# Patient Record
Sex: Female | Born: 1970 | Hispanic: No | Marital: Single | Smoking: Never smoker
Health system: Southern US, Community
[De-identification: ages and names within clinical notes are randomized; demographics above are authoritative.]

## PROBLEM LIST (undated history)

## (undated) DIAGNOSIS — I471 Supraventricular tachycardia, unspecified: Secondary | ICD-10-CM

## (undated) DIAGNOSIS — K219 Gastro-esophageal reflux disease without esophagitis: Secondary | ICD-10-CM

---

## 2007-07-11 ENCOUNTER — Other Ambulatory Visit: Admission: RE | Admit: 2007-07-11 | Discharge: 2007-07-11 | Payer: Self-pay | Admitting: Family Medicine

## 2008-01-22 ENCOUNTER — Emergency Department (HOSPITAL_COMMUNITY): Admission: EM | Admit: 2008-01-22 | Discharge: 2008-01-22 | Payer: Self-pay | Admitting: Family Medicine

## 2008-05-08 ENCOUNTER — Emergency Department (HOSPITAL_COMMUNITY): Admission: EM | Admit: 2008-05-08 | Discharge: 2008-05-08 | Payer: Self-pay | Admitting: Family Medicine

## 2008-05-12 ENCOUNTER — Emergency Department (HOSPITAL_COMMUNITY): Admission: EM | Admit: 2008-05-12 | Discharge: 2008-05-12 | Payer: Self-pay | Admitting: Emergency Medicine

## 2008-07-26 ENCOUNTER — Other Ambulatory Visit: Admission: RE | Admit: 2008-07-26 | Discharge: 2008-07-26 | Payer: Self-pay | Admitting: Family Medicine

## 2009-10-24 ENCOUNTER — Emergency Department (HOSPITAL_COMMUNITY): Admission: EM | Admit: 2009-10-24 | Discharge: 2009-10-25 | Payer: Self-pay | Admitting: Emergency Medicine

## 2009-10-30 ENCOUNTER — Encounter: Admission: RE | Admit: 2009-10-30 | Discharge: 2009-10-30 | Payer: Self-pay | Admitting: Chiropractic Medicine

## 2010-06-05 LAB — POCT I-STAT, CHEM 8
BUN: 16 mg/dL (ref 6–23)
Calcium, Ion: 1.19 mmol/L (ref 1.12–1.32)
Chloride: 105 mEq/L (ref 96–112)
Creatinine, Ser: 0.9 mg/dL (ref 0.4–1.2)
Glucose, Bld: 85 mg/dL (ref 70–99)
Sodium: 140 mEq/L (ref 135–145)
TCO2: 25 mmol/L (ref 0–100)

## 2010-06-05 LAB — POCT PREGNANCY, URINE: Preg Test, Ur: NEGATIVE

## 2010-06-05 LAB — URINALYSIS, ROUTINE W REFLEX MICROSCOPIC
Glucose, UA: NEGATIVE mg/dL
Ketones, ur: 15 mg/dL — AB
pH: 5.5 (ref 5.0–8.0)

## 2013-07-28 ENCOUNTER — Other Ambulatory Visit: Payer: Self-pay | Admitting: *Deleted

## 2013-07-28 DIAGNOSIS — R002 Palpitations: Secondary | ICD-10-CM

## 2013-07-28 DIAGNOSIS — R42 Dizziness and giddiness: Secondary | ICD-10-CM

## 2013-08-11 ENCOUNTER — Encounter (INDEPENDENT_AMBULATORY_CARE_PROVIDER_SITE_OTHER): Payer: BC Managed Care – PPO

## 2013-08-11 ENCOUNTER — Encounter: Payer: Self-pay | Admitting: *Deleted

## 2013-08-11 DIAGNOSIS — R002 Palpitations: Secondary | ICD-10-CM

## 2013-08-11 DIAGNOSIS — R42 Dizziness and giddiness: Secondary | ICD-10-CM

## 2013-08-11 NOTE — Progress Notes (Signed)
Patient ID: Hannah Buttnerhristina A Gnau, female   DOB: December 16, 1970, 43 y.o.   MRN: [id number] [employer] 30 day cardiac event monitor applied to patient.

## 2015-08-22 ENCOUNTER — Other Ambulatory Visit (HOSPITAL_COMMUNITY): Payer: Self-pay | Admitting: Family Medicine

## 2015-08-22 DIAGNOSIS — I471 Supraventricular tachycardia: Secondary | ICD-10-CM

## 2015-09-06 ENCOUNTER — Ambulatory Visit (HOSPITAL_COMMUNITY): Payer: 59 | Attending: Cardiology

## 2015-09-06 DIAGNOSIS — I071 Rheumatic tricuspid insufficiency: Secondary | ICD-10-CM | POA: Diagnosis not present

## 2015-09-06 DIAGNOSIS — Z8249 Family history of ischemic heart disease and other diseases of the circulatory system: Secondary | ICD-10-CM | POA: Insufficient documentation

## 2015-09-06 DIAGNOSIS — I471 Supraventricular tachycardia: Secondary | ICD-10-CM | POA: Diagnosis not present

## 2015-09-06 DIAGNOSIS — I340 Nonrheumatic mitral (valve) insufficiency: Secondary | ICD-10-CM | POA: Diagnosis not present

## 2015-10-16 ENCOUNTER — Ambulatory Visit (HOSPITAL_COMMUNITY)
Admission: EM | Admit: 2015-10-16 | Discharge: 2015-10-16 | Disposition: A | Discharge status: Home / Self Care | Payer: 59 | Attending: Family Medicine | Admitting: Family Medicine

## 2015-10-16 ENCOUNTER — Encounter (HOSPITAL_COMMUNITY): Payer: Self-pay | Admitting: Emergency Medicine

## 2015-10-16 DIAGNOSIS — R519 Headache, unspecified: Secondary | ICD-10-CM

## 2015-10-16 DIAGNOSIS — R0789 Other chest pain: Secondary | ICD-10-CM

## 2015-10-16 DIAGNOSIS — R51 Headache: Secondary | ICD-10-CM

## 2015-10-16 HISTORY — DX: Gastro-esophageal reflux disease without esophagitis: K21.9

## 2015-10-16 HISTORY — DX: Supraventricular tachycardia, unspecified: I47.10

## 2015-10-16 HISTORY — DX: Supraventricular tachycardia: I47.1

## 2015-10-16 NOTE — ED Triage Notes (Signed)
The [facility] with a complaint of a headache along with pain that started in her chest and has moved over the 4 days into her back, under arm, and neck. The patient denied any SOB.

## 2015-10-16 NOTE — ED Provider Notes (Signed)
CSN: [id number]     Arrival date & time 10/16/15  1219 History   First MD Initiated Contact with Patient 10/16/15 1320     Chief Complaint  Patient presents with  . Headache   (Consider location/radiation/quality/duration/timing/severity/associated sxs/prior Treatment) Hannah Hunter is a well-appearing 45 y.o female with recent history of SVT 2 months ago, presents today for headache, chest tightness, and left arm numbing pain. She started to experience a mild pulsating headache 6 days ago that didn't feel like a typical migraine to her, this headache went away on the same day after taking ibuprofen. Next day (5 days ago), patient started to have chest tightness and pain that is dull and radiates to her bilateral upper back. Patient is unsure if her back pain is related to poor posture from her recent long road trip. The chest pain and the upper back pain have been on and off, describes the pain as aching 1/10. She also endorses some shortness of breath on some occasional, heart palpitation at times and lightheadedness. Currently she is asymptomatic in room. Patient has a cardiologist, recent check-up with cardiologist was on 07/26.       Past Medical History:  Diagnosis Date  . GERD (gastroesophageal reflux disease)   . SVT (supraventricular tachycardia) (HCC)    History reviewed. No pertinent surgical history. History reviewed. No pertinent family history. Social History  Substance Use Topics  . Smoking status: Never Smoker  . Smokeless tobacco: Never Used  . Alcohol use Yes     Comment: occassional   OB History    No data available     Review of Systems  Constitutional: Negative for chills, fatigue and fever.  Respiratory: Positive for chest tightness and shortness of breath. Negative for wheezing.   Cardiovascular: Positive for chest pain and palpitations. Negative for leg swelling.  Gastrointestinal: Negative for abdominal pain, diarrhea, nausea and vomiting.  Musculoskeletal:  Positive for back pain.  Neurological: Positive for light-headedness and headaches. Negative for dizziness, syncope and weakness.    Allergies  Review of patient's allergies indicates no known allergies.  Home Medications   Prior to Admission medications   Medication Sig Start Date End Date Taking? Authorizing Provider  diltiazem (CARTIA XT) 120 MG 24 hr capsule Take 120 mg by mouth daily.   Yes Historical Provider, MD  omeprazole (PRILOSEC) 20 MG capsule Take 20 mg by mouth daily.   Yes Historical Provider, MD   Meds Ordered and Administered this Visit  Medications - No data to display  BP 134/90 (BP Location: Right Arm)   Pulse 64   Temp 98.7 F (37.1 C) (Oral)   Resp 16   Ht 5\' 7"  (1.702 m)   Wt 180 lb (81.6 kg)   LMP 10/07/2015 (Exact Date)   SpO2 98%   BMI 28.19 kg/m  No data found.   Physical Exam  Constitutional: She is oriented to person, place, and time. She appears well-developed and well-nourished.  HENT:  Head: Normocephalic and atraumatic.  Right Ear: External ear normal.  Left Ear: External ear normal.  Eyes: Conjunctivae are normal. Pupils are equal, round, and reactive to light.  Neck: Normal range of motion. Neck supple.  Cardiovascular: Normal rate, regular rhythm and normal heart sounds.   Pulmonary/Chest: Effort normal and breath sounds normal.  Abdominal: Soft. Bowel sounds are normal. She exhibits no mass. There is no tenderness.  Musculoskeletal: Normal range of motion.  Neurological: She is alert and oriented to person, place, and time. Coordination normal.  Skin: Skin is warm and dry.  Psychiatric: She has a normal mood and affect.  Nursing note and vitals reviewed.   Urgent Care Course   Clinical Course    Procedures (including critical care time)  Labs Review Labs Reviewed - No data to display  Imaging Review No results found.     MDM   1. Chest tightness   2. Intractable headache, unspecified chronicity pattern,  unspecified headache type    Sinus rhythm on EKG. There is no axis deviation. There is no ectopy, no evidence of heart block or conduction disturbance, no bundle branch block, no pathological Q wave, no ST segment deviation, no T wave inversion, no evidence of pre-excitation or prolonged QT interval and no evidence of chamber enlargement. Patient appears in no acute distress, her physical examination was normal with no red flags noted. She is currently asymptomatic. Given her recent SVT and history,  patient recommended to follow up with her cardiologist today, if not able to get in with her cardiologist today then recommended to go to the Emergency department. Patient was able to make an appt with her cardiologist for today,  and is leaving here to head straight to her cardiologist's office [facility] for further evaluation.    Lucia EstelleFeng Dayson Aboud, NP 10/16/15 [phone]

## 2015-11-13 ENCOUNTER — Other Ambulatory Visit (HOSPITAL_BASED_OUTPATIENT_CLINIC_OR_DEPARTMENT_OTHER): Payer: Self-pay | Admitting: Family Medicine

## 2015-11-13 DIAGNOSIS — Z1231 Encounter for screening mammogram for malignant neoplasm of breast: Secondary | ICD-10-CM

## 2015-11-19 ENCOUNTER — Ambulatory Visit (HOSPITAL_BASED_OUTPATIENT_CLINIC_OR_DEPARTMENT_OTHER)
Admission: RE | Admit: 2015-11-19 | Discharge: 2015-11-19 | Disposition: A | Discharge status: Home / Self Care | Payer: 59 | Source: Ambulatory Visit | Attending: Family Medicine | Admitting: Family Medicine

## 2015-11-19 DIAGNOSIS — Z1231 Encounter for screening mammogram for malignant neoplasm of breast: Secondary | ICD-10-CM | POA: Insufficient documentation

## 2016-03-26 DIAGNOSIS — I471 Supraventricular tachycardia: Secondary | ICD-10-CM | POA: Diagnosis not present

## 2016-03-26 DIAGNOSIS — R5383 Other fatigue: Secondary | ICD-10-CM | POA: Diagnosis not present

## 2016-03-26 DIAGNOSIS — Z8249 Family history of ischemic heart disease and other diseases of the circulatory system: Secondary | ICD-10-CM | POA: Diagnosis not present

## 2016-04-03 DIAGNOSIS — R5383 Other fatigue: Secondary | ICD-10-CM | POA: Diagnosis not present

## 2016-04-03 DIAGNOSIS — I471 Supraventricular tachycardia: Secondary | ICD-10-CM | POA: Diagnosis not present

## 2016-05-02 DIAGNOSIS — R5383 Other fatigue: Secondary | ICD-10-CM | POA: Diagnosis not present

## 2016-05-02 DIAGNOSIS — I471 Supraventricular tachycardia: Secondary | ICD-10-CM | POA: Diagnosis not present

## 2016-11-10 DIAGNOSIS — Z0000 Encounter for general adult medical examination without abnormal findings: Secondary | ICD-10-CM | POA: Diagnosis not present

## 2016-11-20 DIAGNOSIS — Z0000 Encounter for general adult medical examination without abnormal findings: Secondary | ICD-10-CM | POA: Diagnosis not present

## 2017-10-22 DIAGNOSIS — R531 Weakness: Secondary | ICD-10-CM | POA: Diagnosis not present

## 2017-10-22 DIAGNOSIS — Z131 Encounter for screening for diabetes mellitus: Secondary | ICD-10-CM | POA: Diagnosis not present

## 2017-10-22 DIAGNOSIS — Z0000 Encounter for general adult medical examination without abnormal findings: Secondary | ICD-10-CM | POA: Diagnosis not present

## 2017-10-22 DIAGNOSIS — E7800 Pure hypercholesterolemia, unspecified: Secondary | ICD-10-CM | POA: Diagnosis not present

## 2017-10-29 ENCOUNTER — Other Ambulatory Visit (HOSPITAL_BASED_OUTPATIENT_CLINIC_OR_DEPARTMENT_OTHER): Payer: Self-pay | Admitting: Adult Health Nurse Practitioner

## 2017-10-29 DIAGNOSIS — Z1231 Encounter for screening mammogram for malignant neoplasm of breast: Secondary | ICD-10-CM

## 2018-01-31 DIAGNOSIS — R5383 Other fatigue: Secondary | ICD-10-CM | POA: Diagnosis not present

## 2018-01-31 DIAGNOSIS — I471 Supraventricular tachycardia: Secondary | ICD-10-CM | POA: Diagnosis not present

## 2018-01-31 DIAGNOSIS — R9431 Abnormal electrocardiogram [ECG] [EKG]: Secondary | ICD-10-CM | POA: Diagnosis not present

## 2018-01-31 DIAGNOSIS — Z79899 Other long term (current) drug therapy: Secondary | ICD-10-CM | POA: Diagnosis not present

## 2018-01-31 DIAGNOSIS — R001 Bradycardia, unspecified: Secondary | ICD-10-CM | POA: Diagnosis not present

## 2019-01-06 ENCOUNTER — Other Ambulatory Visit (HOSPITAL_BASED_OUTPATIENT_CLINIC_OR_DEPARTMENT_OTHER): Payer: Self-pay | Admitting: Family Medicine

## 2019-01-06 DIAGNOSIS — Z1231 Encounter for screening mammogram for malignant neoplasm of breast: Secondary | ICD-10-CM

## 2019-01-24 ENCOUNTER — Ambulatory Visit (HOSPITAL_BASED_OUTPATIENT_CLINIC_OR_DEPARTMENT_OTHER)
Admission: RE | Admit: 2019-01-24 | Discharge: 2019-01-24 | Disposition: A | Discharge status: Home / Self Care | Payer: 59 | Source: Ambulatory Visit | Attending: Family Medicine | Admitting: Family Medicine

## 2019-01-24 ENCOUNTER — Encounter (HOSPITAL_BASED_OUTPATIENT_CLINIC_OR_DEPARTMENT_OTHER): Payer: Self-pay

## 2019-01-24 ENCOUNTER — Other Ambulatory Visit: Payer: Self-pay

## 2019-01-24 DIAGNOSIS — Z1231 Encounter for screening mammogram for malignant neoplasm of breast: Secondary | ICD-10-CM | POA: Diagnosis not present

## 2019-01-25 ENCOUNTER — Other Ambulatory Visit: Payer: Self-pay | Admitting: Family Medicine

## 2019-01-25 DIAGNOSIS — R928 Other abnormal and inconclusive findings on diagnostic imaging of breast: Secondary | ICD-10-CM

## 2019-01-27 ENCOUNTER — Other Ambulatory Visit: Payer: Self-pay

## 2019-01-27 ENCOUNTER — Ambulatory Visit
Admission: RE | Admit: 2019-01-27 | Discharge: 2019-01-27 | Disposition: A | Discharge status: Home / Self Care | Payer: 59 | Source: Ambulatory Visit | Attending: Family Medicine | Admitting: Family Medicine

## 2019-01-27 ENCOUNTER — Ambulatory Visit: Payer: 59

## 2019-01-27 DIAGNOSIS — R928 Other abnormal and inconclusive findings on diagnostic imaging of breast: Secondary | ICD-10-CM

## 2020-12-30 ENCOUNTER — Other Ambulatory Visit: Payer: Self-pay | Admitting: Family Medicine

## 2020-12-30 DIAGNOSIS — Z1231 Encounter for screening mammogram for malignant neoplasm of breast: Secondary | ICD-10-CM

## 2021-01-02 ENCOUNTER — Ambulatory Visit: Payer: 59

## 2021-03-17 ENCOUNTER — Ambulatory Visit
Admission: RE | Admit: 2021-03-17 | Discharge: 2021-03-17 | Disposition: A | Discharge status: Home / Self Care | Payer: 59 | Source: Ambulatory Visit | Attending: Family Medicine | Admitting: Family Medicine

## 2021-03-17 DIAGNOSIS — Z1231 Encounter for screening mammogram for malignant neoplasm of breast: Secondary | ICD-10-CM

## 2021-09-25 ENCOUNTER — Other Ambulatory Visit: Payer: Self-pay | Admitting: Sports Medicine

## 2021-09-25 ENCOUNTER — Ambulatory Visit
Admission: RE | Admit: 2021-09-25 | Discharge: 2021-09-25 | Disposition: A | Discharge status: Home / Self Care | Payer: 59 | Source: Ambulatory Visit | Attending: Sports Medicine | Admitting: Sports Medicine

## 2021-09-25 DIAGNOSIS — M542 Cervicalgia: Secondary | ICD-10-CM

## 2021-11-24 ENCOUNTER — Ambulatory Visit (INDEPENDENT_AMBULATORY_CARE_PROVIDER_SITE_OTHER): Payer: 59 | Admitting: Neurology

## 2021-11-24 ENCOUNTER — Encounter: Payer: Self-pay | Admitting: Neurology

## 2021-11-24 VITALS — BP 132/79 | HR 64 | Ht 67.0 in | Wt 202.8 lb

## 2021-11-24 DIAGNOSIS — G509 Disorder of trigeminal nerve, unspecified: Secondary | ICD-10-CM

## 2021-11-24 DIAGNOSIS — R200 Anesthesia of skin: Secondary | ICD-10-CM

## 2021-11-24 DIAGNOSIS — R202 Paresthesia of skin: Secondary | ICD-10-CM | POA: Diagnosis not present

## 2021-11-24 DIAGNOSIS — R42 Dizziness and giddiness: Secondary | ICD-10-CM | POA: Diagnosis not present

## 2021-11-24 NOTE — Patient Instructions (Signed)
MRi of the brain 

## 2021-11-24 NOTE — Progress Notes (Addendum)
[facility]    Provider:  Dr Lucia Gaskins Requesting Provider: Jene Every, MD Primary Care Provider:  Joycelyn Rua, MD  CC:  Left facial numbness and tingling  HPI:  Hannah Hunter is a 51 y.o. female here as requested by Jene Every, MD for face dyesthesias. PMHx chronic neck pain, SVT s/p ablation, migraines once a month during period helped with ibuprofen s/p MRI cervical spine and seen Dr. Shelle Iron for this, per his notes that I reviewed she had a cervical spine MRI which showed a chronic T3 compression fracture mild superior endplate, moderate foraminal narrowing at C5-C6 on the left due to disc osteophyte complex, mild foraminal narrowing on the right at C6-C7.  His impression was intermittent axial cervical pain secondary to cervical spondylosis at C5-C6 C6-C7, no radiculopathy, neurologically intact, remote T3 compression fracture, increasing facial dysesthesias frontal lateral etiology indeterminant for which he sent her here.  He sent her for dysesthesias in the facial region.  Symptoms come and go. Started in 2019. She has some tingling in her left eye brow, side of the nose and upper and lower lip. She gets migraines once a month and treated with OTC meds during her period. The tingling will go away for a few months. In July she had dizziness with the tinglingin the pace and paresthesias in the back of the scalp and lasted a week and was very bad and that is when she made the appointments, sensations in the face for 4 years. The sensations are not painful more annoting. No muscle weakness, no drooping, no headache, no vision changes, the paresthesias will last for days continuously, PT and massage on the back neck tightness in traps and cervical paraspinals does help with the tingling, triggers can be sitting at computer for long time. No other focal neurologic deficits, associated symptoms, inciting events or modifiable factors.   Reviewed notes, labs and imaging  from outside physicians, which showed:   CT head 10/25/2009: Clinical Data: Vertigo for several months, worse tonight.     CT HEAD WITHOUT CONTRAST     Technique:  Contiguous axial images were obtained from the base of  the skull through the vertex without contrast.     Comparison: Head CT and angiography 05/12/2008.     Findings: There is no evidence of acute intracranial hemorrhage,  mass lesion, brain edema or extra-axial fluid collection.  The  ventricles and subarachnoid spaces are appropriately sized for age.  There is no CT evidence of acute cortical infarction.     The visualized paranasal sinuses are clear. The mastoids and middle  ears are clear. The calvarium is intact.     IMPRESSION:  Stable examination.  No acute intracranial findings.   Review of Systems: Patient complains of symptoms per HPI as well as the following symptoms: neck pain and muscle tightness. Pertinent negatives and positives per HPI. All others negative.  Cmp 08/2021 nml   Social History   Socioeconomic History   Marital status: Single    Spouse name: Not on file   Number of children: Not on file   Years of education: Not on file   Highest education level: Not on file  Occupational History   Not on file  Tobacco Use   Smoking status: Never   Smokeless tobacco: Never  Vaping Use   Vaping Use: Never used  Substance and Sexual Activity   Alcohol use: Yes    Comment: occassional   Drug use: No   Sexual activity: Not  on file  Other Topics Concern   Not on file  Social History Narrative   Not on file   Social Determinants of Health   Financial Resource Strain: Not on file  Food Insecurity: Not on file  Transportation Needs: Not on file  Physical Activity: Not on file  Stress: Not on file  Social Connections: Not on file  Intimate Partner Violence: Not on file    Family History  Problem Relation Age of Onset   Stroke Mother    Breast cancer Neg Hx    Neuropathy Neg Hx      Past Medical History:  Diagnosis Date   GERD (gastroesophageal reflux disease)    SVT (supraventricular tachycardia)     There are no problems to display for this patient.   History reviewed. No pertinent surgical history.  Current Outpatient Medications  Medication Sig Dispense Refill   Ascorbic Acid (VITAMIN C PO) Take by mouth.     Omega-3 Fatty Acids (FISH OIL PO) Take by mouth.     VITAMIN D PO Take by mouth.     No current facility-administered medications for this visit.    Allergies as of 11/24/2021   (No Known Allergies)    Vitals: BP 132/79   Pulse 64   Ht 5\' 7"  (1.702 m)   Wt 202 lb 12.8 oz (92 kg)   BMI 31.76 kg/m  Last Weight:  Wt Readings from Last 1 Encounters:  11/24/21 202 lb 12.8 oz (92 kg)   Last Height:   Ht Readings from Last 1 Encounters:  11/24/21 5\' 7"  (1.702 m)     Physical exam: Exam: Gen: NAD, conversant, well nourised, obese, well groomed                     CV: RRR, no MRG. No Carotid Bruits. No peripheral edema, warm, nontender Eyes: Conjunctivae clear without exudates or hemorrhage  Neuro: Detailed Neurologic Exam  Speech:    Speech is normal; fluent and spontaneous with normal comprehension.  Cognition:    The patient is oriented to person, place, and time;     recent and remote memory intact;     language fluent;     normal attention, concentration,     fund of knowledge Cranial Nerves:    The pupils are round, and reactive to light, left slightly smaller 94mm(chronic since 2010 and was evaluated in the past for this after head injury). The fundi are normal and spontaneous venous pulsations are present. Visual fields are full to finger confrontation. Extraocular movements are intact. Trigeminal sensation is intact and the muscles of mastication are normal. The face is symmetric. The palate elevates in the midline. Hearing intact. Voice is normal. Shoulder shrug is normal. The tongue has normal motion without  fasciculations.   Coordination:    Normal   Gait:    normal.   Motor Observation:    No asymmetry, no atrophy, and no involuntary movements noted. Tone:    Normal muscle tone.    Posture:    Posture is normal. normal erect    Strength:    Strength is V/V in the upper and lower limbs.      Sensation: intact to LT     Reflex Exam:  DTR's:    Deep tendon reflexes in the upper and lower extremities are normal bilaterally.   Toes:    The toes are downgoing bilaterally.   Clonus:    2 beats at AJs (may be normal)  Assessment/Plan:  51 y.o. female here as requested by Jene Every, MD for face paresthesias left side of face that can last for days, episodic since 2019, left eye brow, side of the nose and upper and lower lip. , not assicated with migraines or other focal neurologic symptoms except sn episode with severe dizziness and felt she was foggy and paresthesias in the back of the head as well . PMHx chronic neck pain, SVT s/p ablation, migraines once a month during period helped with ibuprofen s/p unremarkable MRI cervical spine and seen Dr. Shelle Iron for her neck. She just wants to make sure everything is ok and we can order an MRI.   MRI brain due to concerning symptoms of left facial numbness and paresthesias, occipital paresthesias, dizziness with cognitive changes  to look for space occupying mass, chiari or intracranial hypertension (pseudotumor), strokes, malignancies, vasculidities, demyelination(multiple sclerosis) or other   Orders Placed This Encounter  Procedures   MR BRAIN W WO CONTRAST   No orders of the defined types were placed in this encounter.   Cc: Jene Every, MD,  Joycelyn Rua, MD  Naomie Dean, MD  [facility] [address] [place], [place] [address]  Phone [phone] Fax [phone]

## 2021-11-26 ENCOUNTER — Telehealth: Payer: Self-pay | Admitting: Neurology

## 2021-11-26 NOTE — Telephone Encounter (Signed)
VM box full, sent mychart msg asking pt to call back and schedule MRI.  [facility] auth: npr case # [id number]

## 2021-12-03 ENCOUNTER — Ambulatory Visit (INDEPENDENT_AMBULATORY_CARE_PROVIDER_SITE_OTHER): Payer: 59

## 2021-12-03 DIAGNOSIS — R200 Anesthesia of skin: Secondary | ICD-10-CM

## 2021-12-03 DIAGNOSIS — R202 Paresthesia of skin: Secondary | ICD-10-CM | POA: Diagnosis not present

## 2021-12-03 DIAGNOSIS — R42 Dizziness and giddiness: Secondary | ICD-10-CM

## 2021-12-03 DIAGNOSIS — G509 Disorder of trigeminal nerve, unspecified: Secondary | ICD-10-CM | POA: Diagnosis not present

## 2021-12-03 MED ORDER — GADOBENATE DIMEGLUMINE 529 MG/ML IV SOLN
20.0000 mL | Freq: Once | INTRAVENOUS | Status: AC | PRN
  Administered 2021-12-03: 20 mL via INTRAVENOUS

## 2021-12-08 ENCOUNTER — Telehealth: Payer: Self-pay | Admitting: Neurology

## 2021-12-08 DIAGNOSIS — G509 Disorder of trigeminal nerve, unspecified: Secondary | ICD-10-CM

## 2021-12-08 NOTE — Telephone Encounter (Signed)
I called patient to discuss. VM not available. Unable to leave a message. I sent a mychart message.

## 2021-12-08 NOTE — Telephone Encounter (Signed)
Patient returned my call.  She has many questions about the vascular loop.  She is wondering if the vascular loop could cause vertigo.  She also is wondering if this is a congenital malformation.  She is wondering if she is at risk for aneurysms.  I offered her an appointment with Dr. Jaynee Eagles but there are no soon appointments.  She is wondering if Dr. Jaynee Eagles would mind calling her to discuss this as scheduled for more detail.  She is reluctant to proceed with neurosurgery.  She feels like her symptoms are manageable but has many concerns about the vascular loop itself.

## 2021-12-08 NOTE — Telephone Encounter (Signed)
Patient has a vasacular loop compressing the trigeminal nerve. I can send her to Dr. Zada Finders at [place] [facility] or Dr. Arlan Organ at [facility] to discuss surgical options if she likes please call and discuss (see phone note)

## 2021-12-08 NOTE — Telephone Encounter (Signed)
I am referring her to neurosurgery so they can dicuss all of this with her, it is just a consult not the actual surgery, just for her to know her options but she does not have to see them at all  The vascular loop won;t cause vertigo it is just irritating the trigeminal nerve by rubbing against it and that's what causes the facial pain. If the pain is manageable we don;t have to do anything about it. No aneurysms seen It can be a congenital malformation but usually the brain changes as we age and now the blood vessel is touching a nerve, it is the most common reason we see for trigeminal neuralgia and we did discuss it at appointment. Nothing has to be done about it, it's not causing any damage, it just can cause pain when it irritates the nerve If she has many questions, the most appropriate would be an appointment even video I have some openings Nov 14/15th now, in the meantime reassure her this is not an emergency or even urgent nothing will happen to her again the blood vessel just irritates the nerve just like if she had sciatica or any other nerve irritation thanks thanks

## 2021-12-09 ENCOUNTER — Telehealth: Payer: Self-pay | Admitting: Neurology

## 2021-12-09 NOTE — Addendum Note (Signed)
Addended by: Lester Dana A on: 12/09/2021 03:53 PM   Modules accepted: Orders

## 2021-12-09 NOTE — Telephone Encounter (Signed)
Sent referral to Dr. Zada Finders at [facility], phone # [phone].

## 2022-01-14 ENCOUNTER — Other Ambulatory Visit: Payer: Self-pay | Admitting: Family Medicine

## 2022-01-14 DIAGNOSIS — R1011 Right upper quadrant pain: Secondary | ICD-10-CM

## 2022-01-21 ENCOUNTER — Ambulatory Visit (INDEPENDENT_AMBULATORY_CARE_PROVIDER_SITE_OTHER): Payer: 59

## 2022-01-21 DIAGNOSIS — R1011 Right upper quadrant pain: Secondary | ICD-10-CM | POA: Diagnosis not present

## 2022-02-03 IMAGING — MG MM DIGITAL SCREENING BILAT W/ TOMO AND CAD
8 series · 9 of 24 positions shown · non-contrast
Comparison: Previous exam(s).

CLINICAL DATA: Screening.

EXAM:
DIGITAL SCREENING BILATERAL MAMMOGRAM WITH TOMOSYNTHESIS AND CAD
TECHNIQUE: Bilateral screening digital craniocaudal and mediolateral oblique
mammograms were obtained. Bilateral screening digital breast
tomosynthesis was performed. The images were evaluated with
computer-aided detection.

[L CC synth-2D]
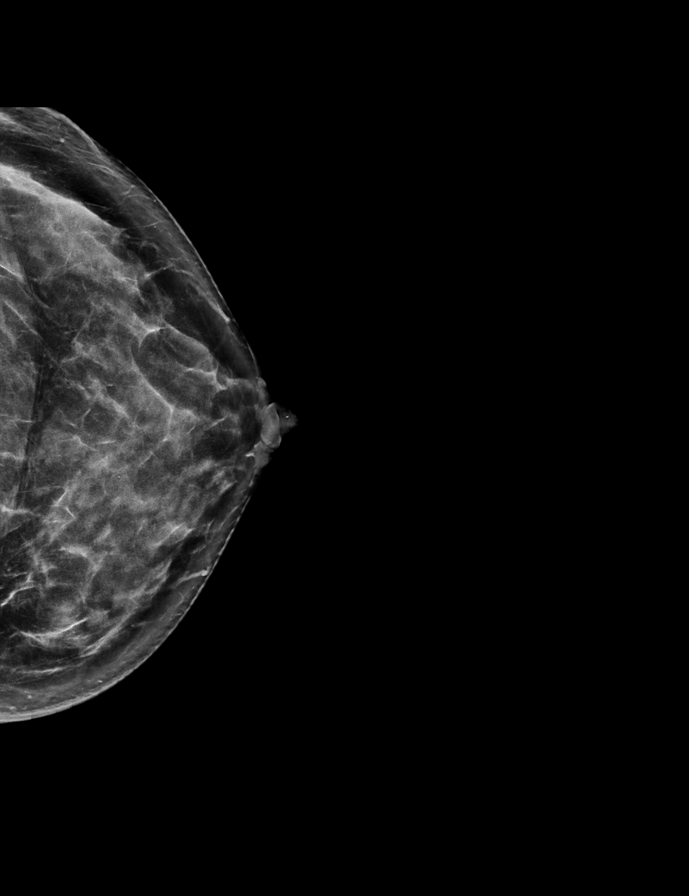

[R MLO synth-2D]
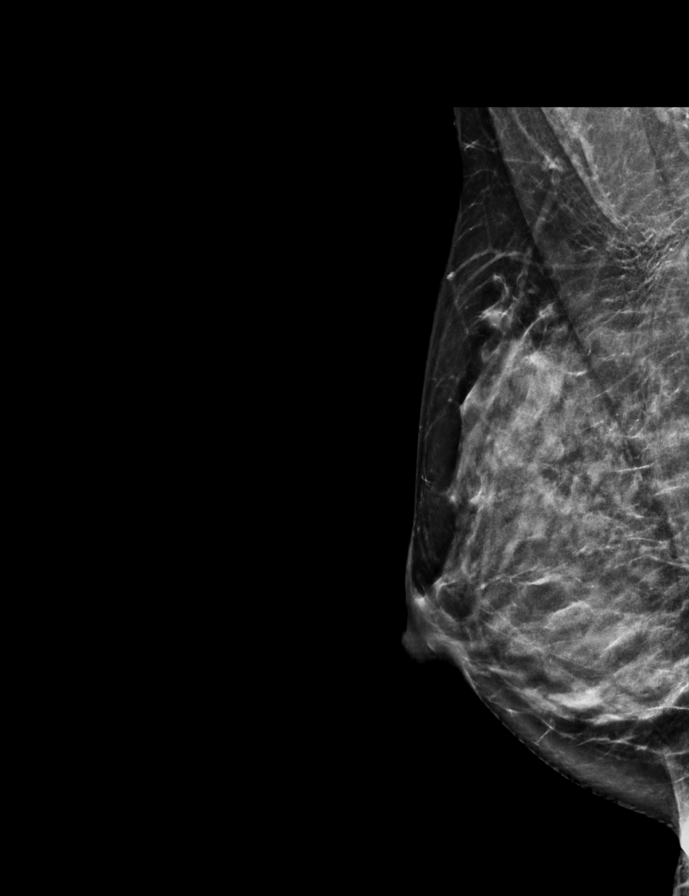

[R CC synth-2D]
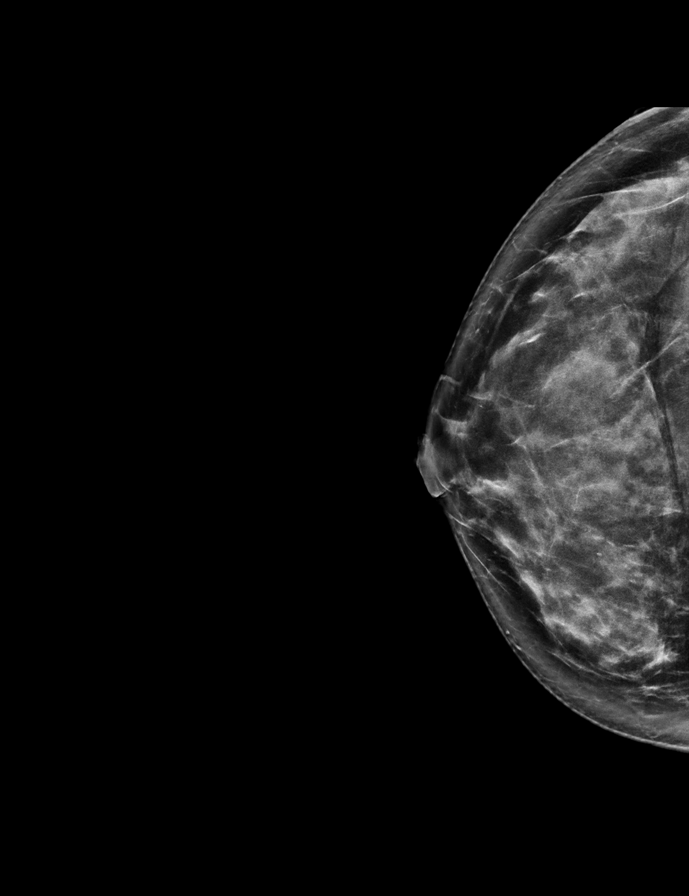

[L MLO synth-2D]
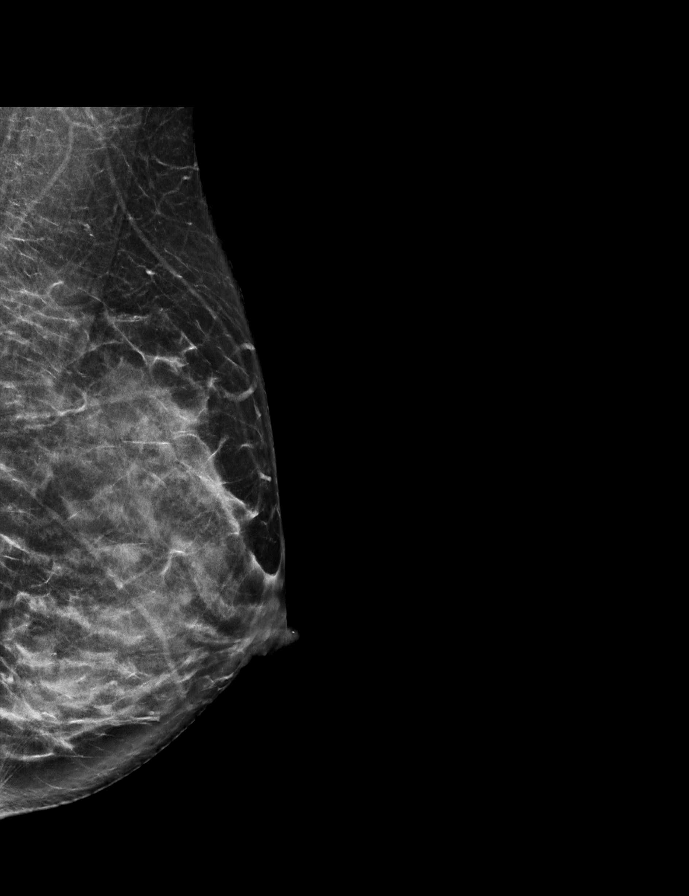

[L MLO tomo · 2 of 64 frames shown]
[frame 21/64]
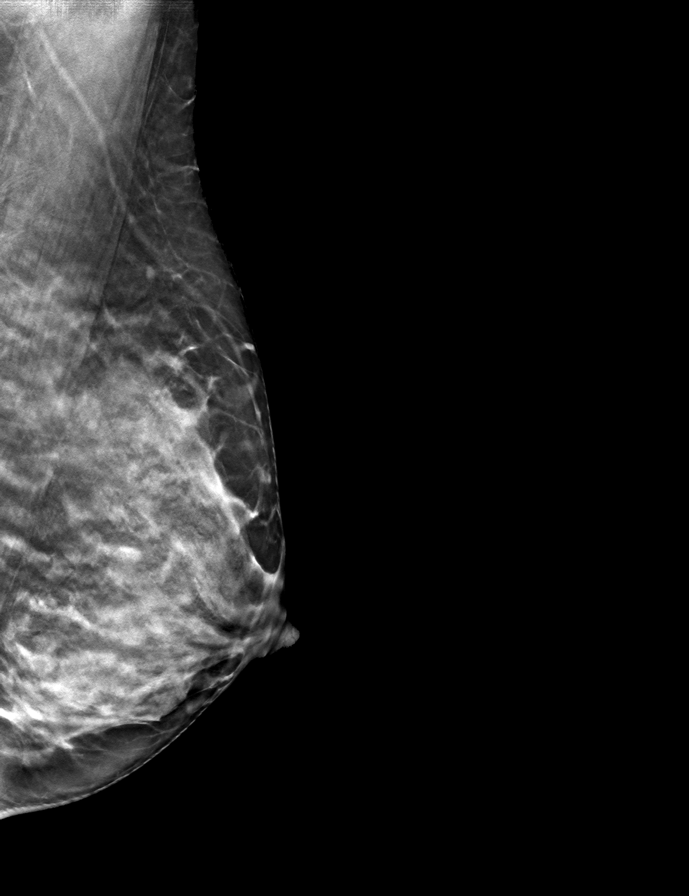
[frame 33/64]
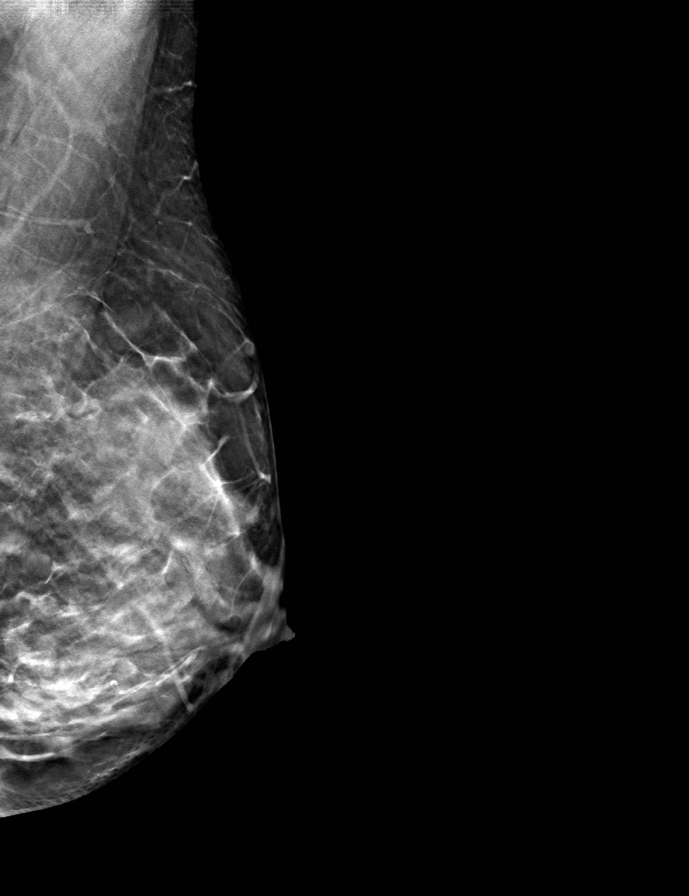

[R MLO tomo · tomo slice 31/62.0]
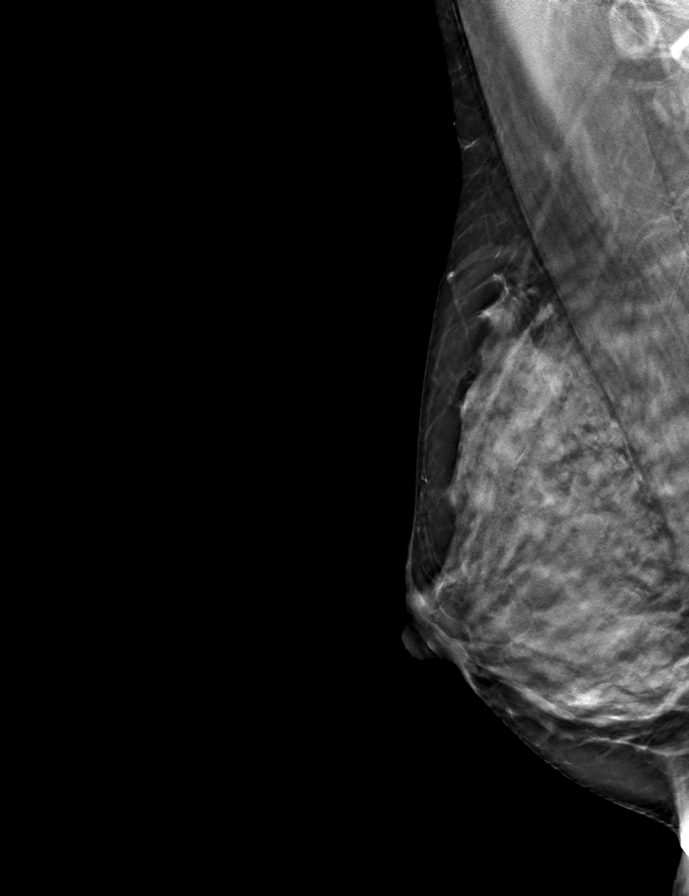

[R CC tomo · tomo slice 33/66.0]
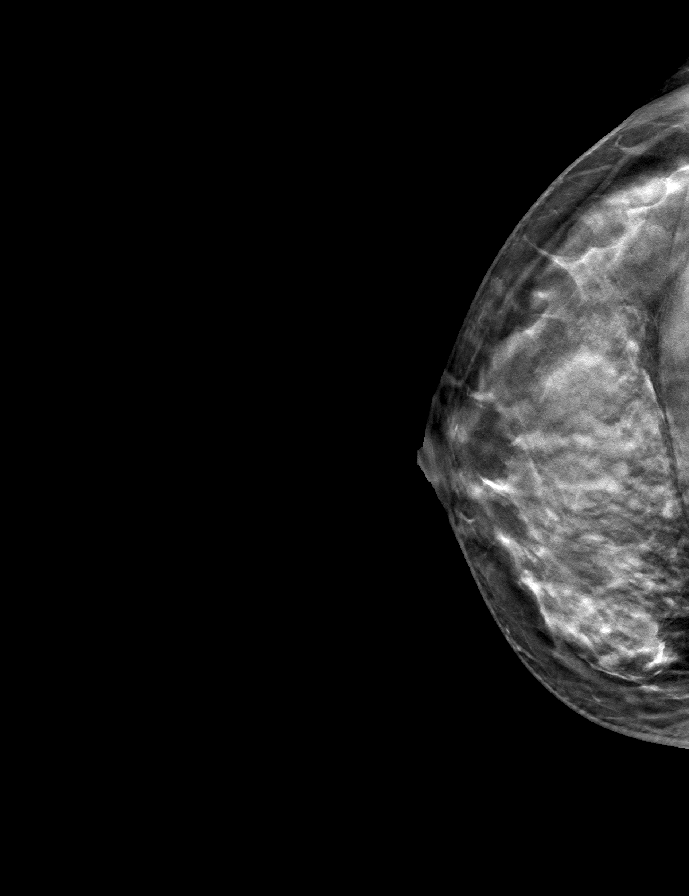

[L CC tomo · tomo slice 32/63.0]
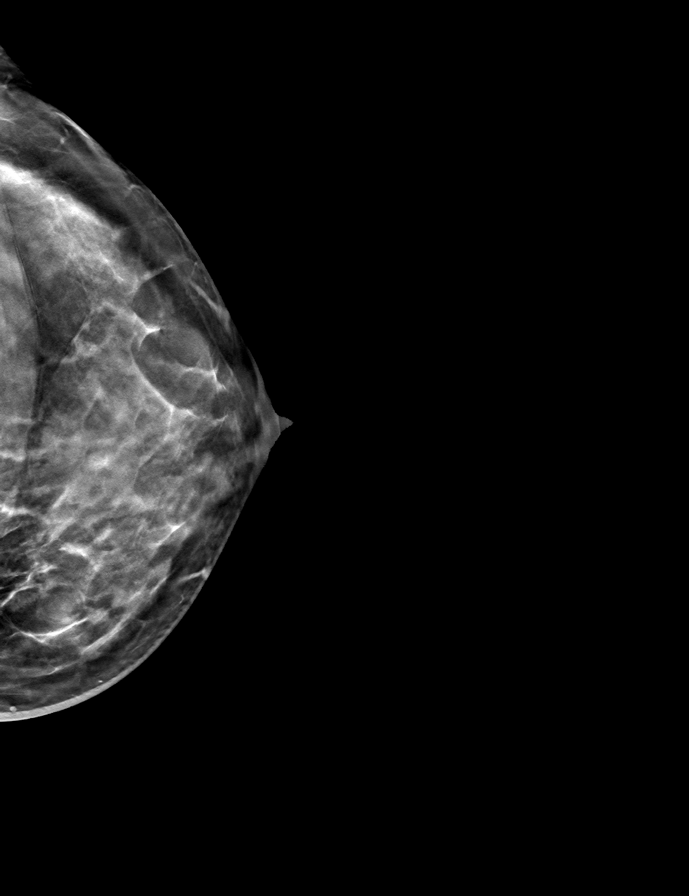

[9 of 24 positions shown; findings below may reference images not displayed]

ACR Breast Density Category c: The breast tissue is heterogeneously
dense, which may obscure small masses.
FINDINGS: There are no findings suspicious for malignancy.
IMPRESSION: No mammographic evidence of malignancy. A result letter of this
screening mammogram will be mailed directly to the patient.

RECOMMENDATION:
Screening mammogram in one year. (Code:Q3-W-BC3)

BI-RADS CATEGORY  1: Negative.

## 2022-02-12 ENCOUNTER — Other Ambulatory Visit: Payer: Self-pay | Admitting: Family Medicine

## 2022-02-12 DIAGNOSIS — Z1231 Encounter for screening mammogram for malignant neoplasm of breast: Secondary | ICD-10-CM

## 2022-04-07 ENCOUNTER — Ambulatory Visit
Admission: RE | Admit: 2022-04-07 | Discharge: 2022-04-07 | Disposition: A | Discharge status: Home / Self Care | Payer: 59 | Source: Ambulatory Visit | Attending: Family Medicine | Admitting: Family Medicine

## 2022-04-07 DIAGNOSIS — Z1231 Encounter for screening mammogram for malignant neoplasm of breast: Secondary | ICD-10-CM

## 2022-07-16 ENCOUNTER — Other Ambulatory Visit: Payer: Self-pay | Admitting: Family Medicine

## 2022-07-16 DIAGNOSIS — R1900 Intra-abdominal and pelvic swelling, mass and lump, unspecified site: Secondary | ICD-10-CM

## 2022-08-12 ENCOUNTER — Ambulatory Visit (INDEPENDENT_AMBULATORY_CARE_PROVIDER_SITE_OTHER): Payer: 59

## 2022-08-12 DIAGNOSIS — R1900 Intra-abdominal and pelvic swelling, mass and lump, unspecified site: Secondary | ICD-10-CM | POA: Diagnosis not present

## 2023-11-01 ENCOUNTER — Ambulatory Visit (INDEPENDENT_AMBULATORY_CARE_PROVIDER_SITE_OTHER): Admitting: Family Medicine

## 2023-11-01 ENCOUNTER — Encounter: Payer: Self-pay | Admitting: Family Medicine

## 2023-11-01 VITALS — BP 114/86 | Ht 67.0 in | Wt 195.0 lb

## 2023-11-01 DIAGNOSIS — R5382 Chronic fatigue, unspecified: Secondary | ICD-10-CM | POA: Diagnosis not present

## 2023-11-01 DIAGNOSIS — R0683 Snoring: Secondary | ICD-10-CM

## 2023-11-01 DIAGNOSIS — R0689 Other abnormalities of breathing: Secondary | ICD-10-CM | POA: Diagnosis not present

## 2023-11-01 DIAGNOSIS — R790 Abnormal level of blood mineral: Secondary | ICD-10-CM | POA: Diagnosis not present

## 2023-11-01 NOTE — Progress Notes (Signed)
 DATE OF VISIT: 11/01/2023        Tawni DELENA Rous DOB: December 29, 1970 MRN: [id number]  CC:  Exercise fatigue  History- Hannah Hunter is a 53 y.o. 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 Feels like struggling for a long time Feels has good endurance - able to ride up to 6+ hours/day During a race having issues - race Labor Day weekend - in 2nd stage needed to stop and felt out of breath - once recovers is ok Ok with interval training on [place] When going up hills feels very tired and fatigued Sometimes trouble catching her breath Denies chest pain Denies cough or wheezing  Has spoke to PCP about this - wondering if anemic - sister's have issue - possible low ferritin in the past at [employer] (labs around July 2025) - states other labs have been good Hx of heart issue - hx SVT & atrial flutter- s/p ablation ~2 years ago (on CCBs x 2 years prior to ablation) - following ablation felt very tired for a few months - was able to get heart rate higher and not feel as exhausted, previously only able to get to 160 bpm and felt horrible - occ palpitations  Seen by Cardiology 05/04/23  - last ECHO 10/24/21 showing EF 55-60%, mild left atrial & right atrial enlargement  Hx COVID x 3 - last about 2023 - was having issues prior to COVID infections No known hx of Mono  Taking Iron supplement intermittently - taking Iron bis-glycinate 26mg  once a day (also has B12, Folate, Vit C)  Exercise: - 2 days/wk of weights - bike 2-3 days/wk (riding varies depending on training; generally 10-12 miles on trail rides, usually on bike at least an hr most days) - having rest/off days from exercise  Sleep: 6-7 hrs/night, feels rested, does think she snores a lot  EtOH: rarely - once a month Drugs: none Job: [employer]  Cancer screening: - colonoscopy 1.5 years ago - normal - UTD mammogram - UTD pap smears  Diet:  - no restrictions - no  special diets - was told to work on diet/nutrition and try elimination diet in the past  Menstrual cycles: - more irregular over the last year -  moderate bleeding   Fam hx: - Dad: CABG/MI in 53s, now 93yo and living - Mom: SVT, died of CVA age 17yo - Brother: healthy - Sisters: 1 with anemia; 1 with gallbladder issues and vertigo    Past Medical History Past Medical History:  Diagnosis Date   GERD (gastroesophageal reflux disease)    SVT (supraventricular tachycardia) (HCC)     Past Surgical History History reviewed. No pertinent surgical history.  Medications Current Outpatient Medications  Medication Sig Dispense Refill   busPIRone (BUSPAR) 15 MG tablet Take 15 mg by mouth 2 (two) times daily as needed.     Ascorbic Acid (VITAMIN C PO) Take by mouth.     Omega-3 Fatty Acids (FISH OIL PO) Take by mouth.     VITAMIN D PO Take by mouth.     No current facility-administered medications for this visit.    Allergies is allergic to banana (diagnostic) and latex.  Family History - reviewed per EMR and intake form  Social History   reports current alcohol use.  reports that she has never smoked. She has never used smokeless tobacco.  reports no history of drug use. OCCUPATION: [employer]   EXAM: Vitals: BP 114/86   Ht  5' 7 (1.702 m)   Wt 195 lb (88.5 kg)   BMI 30.54 kg/m  General: AOx3, NAD, pleasant SKIN: no rashes or lesions, skin clean, dry, intact HEART: Regular rate and rhythm, no murmurs, rubs, gallops LUNGS: Clear to auscultation bilaterally, no wheeze, rhonchi, rales.  Normal respiratory effort.  Speaking in complete sentences VASC: pulses 2+ and symmetric bilaterally, no edema PSYCH: Answering questions appropriately, good eye contact, engaging, good insight  IMAGING: TRANSTHORACIC ECHO 10/24/21 showing: The left ventricular size is normal.  Left ventricular systolic function is normal.  LV ejection fraction = 55-60%.  No segmental wall motion  abnormalities seen in the left ventricle  The right ventricle is normal in size and function.  The left atrium is mildly dilated.  The right atrium is mildly dilated.  There is no significant valvular stenosis or regurgitation.  The aortic sinus is normal size.  Normal IVC size and collapsibility; Right atrial pressure is estimated  to be 3 mm Hg.  There is no pericardial effusion.   LABS: 08/31/23 at [facility] reviewed in patient's [facility] portal during visit today showing:  Ferritin 15 IRON TOTAL 76 TIBC 333 IRON %SAT 23  A1c 5.7  TC 192 HDL 58 TG 93 LDL 115  VIT D 35  CMP NL  CK - NL 46  CBC  - WBC 5.1 - HG 13.3 - HCT 40.7 - PLT 229 - NL INDICES  HS CRP 1.3 CORTISOL - NL 19.1 DHEA 98 FOLATE 9.1  INSULIN 5.6 TSH 1.18 B12 502 PROGESTERONE 4.1 SEX HORMONE BINDING GLOB 59 ESTRADIOL 110 APOB 89 TESTOSTERONE 25 Assessment & Plan Chronic fatigue Chronic, recurrent fatigue with exercise, previous history of SVT and atrial flutter status post cardiac ablation in the past, states that her heart rate has been well-controlled since that time.  Despite ablation continues to have ongoing fatigue with exercise. - Does have some associated snoring and breathing difficulty during these episodes and exacerbations.  No prior pulmonary evaluation  Plan: - Reviewed previous cardiology notes and cardiology studies as noted above which were all unremarkable - Reviewed most recent labs from July 2025 as noted above, only significant abnormality was low ferritin of 15.  She has been supplementing with oral iron.  Do not expect that this alone would be contributing to her symptoms - Did review other differential including possible postinfectious etiology, she does have history of COVID.  Symptoms had predated any of her COVID infections.  She does not have any history of mono, but denies any recent illness or specific illness prior to her symptoms.  This is less likely - Recommend  referral to pulmonology for consideration of PFTs and possibly sleep study to evaluate for sleep apnea which could be contributing - She has not had any recent cardiac evaluation.  Recommend that she reach out to her cardiologist to discuss possibly updating her stress echo for further evaluation -Reassured patient that after further review of her history and labs does not appear to have any signs of overtraining, or specific nutritional issues - Is okay for her to continue activity as tolerated at this time - Follow-up with me pending cardiology and pulmonology evaluation Snoring Chronic, recurrent fatigue with exercise, previous history of SVT and atrial flutter status post cardiac ablation in the past, states that her heart rate has been well-controlled since that time.  Despite ablation continues to have ongoing fatigue with exercise. - Does have some associated snoring and breathing difficulty during these episodes and exacerbations.  No prior pulmonary evaluation  Plan: -  Recommend referral to pulmonology for consideration of PFTs and possibly sleep study to evaluate for sleep apnea which could be contributing Difficulty breathing Chronic, recurrent fatigue with exercise, previous history of SVT and atrial flutter status post cardiac ablation in the past, states that her heart rate has been well-controlled since that time.  Despite ablation continues to have ongoing fatigue with exercise. - Does have some associated snoring and breathing difficulty during these episodes and exacerbations.  No prior pulmonary evaluation  Plan: -  Recommend referral to pulmonology for consideration of PFTs and possibly sleep study to evaluate for sleep apnea which could be contributing Low ferritin level Low ferritin of 15 on labs in July 2025.  This may be contributing somewhat to her exercise fatigue, but do not feel this is the sole cause of her symptoms  Plan: - Can continue supplemental iron as she is  doing - Should continue to eat a balanced diet - Follow-up pending evaluation with cardiology and pulmonology as noted above  Patient expressed understanding & agreement with above.  I personally spent a total of 60 minutes in the care of the patient today including preparing to see the patient, getting/reviewing separately obtained history, performing a medically appropriate exam/evaluation, counseling and educating, placing orders, documenting clinical information in the EHR, and independently interpreting results.   Encounter Diagnoses  Name Primary?   Chronic fatigue Yes   Snoring    Difficulty breathing     Orders Placed This Encounter  Procedures   Ambulatory referral to Pulmonology    Orders Placed This Encounter  Procedures   Ambulatory referral to Pulmonology
